# Patient Record
Sex: Male | Born: 1975 | Race: Black or African American | Hispanic: No | Marital: Single | Smoking: Never smoker
Health system: Southern US, Community
[De-identification: ages and names within clinical notes are randomized; demographics above are authoritative.]

## PROBLEM LIST (undated history)

## (undated) DIAGNOSIS — H209 Unspecified iridocyclitis: Secondary | ICD-10-CM

---

## 2010-02-22 ENCOUNTER — Emergency Department: Payer: Self-pay | Admitting: Unknown Physician Specialty

## 2011-09-15 ENCOUNTER — Emergency Department: Payer: Self-pay | Admitting: Emergency Medicine

## 2016-01-14 ENCOUNTER — Emergency Department
Admission: EM | Admit: 2016-01-14 | Discharge: 2016-01-14 | Disposition: A | Discharge status: Home / Self Care | Payer: Self-pay | Attending: Student | Admitting: Student

## 2016-01-14 DIAGNOSIS — H66004 Acute suppurative otitis media without spontaneous rupture of ear drum, recurrent, right ear: Secondary | ICD-10-CM | POA: Insufficient documentation

## 2016-01-14 MED ORDER — IBUPROFEN 600 MG PO TABS
600.0000 mg | ORAL_TABLET | Freq: Once | ORAL | Status: AC
  Administered 2016-01-14: 600 mg via ORAL
  Filled 2016-01-14: qty 1

## 2016-01-14 MED ORDER — NAPROXEN 500 MG PO TABS: 500.0000 mg | ORAL_TABLET | Freq: Two times a day (BID) | ORAL | Status: AC | PRN

## 2016-01-14 MED ORDER — AMOXICILLIN-POT CLAVULANATE 875-125 MG PO TABS: 1.0000 | ORAL_TABLET | Freq: Two times a day (BID) | ORAL | Status: AC

## 2016-01-14 NOTE — ED Notes (Addendum)
Pt states he finished a 10 day dose of amoxicillin for an ear infection on Monday 3/20 and felt symptoms were relieved until he started having symptoms of a head cold on Tuesday. States pain radiates from ear to jaw and rates pain as a 8.

## 2016-01-14 NOTE — ED Notes (Signed)
Pt alert and oriented X4, active, cooperative, pt in NAD. RR even and unlabored, color WNL.  Pt informed to return if any life threatening symptoms occur.   

## 2016-01-14 NOTE — ED Notes (Signed)
Pt in with co right earache x few weeks [facility] ago and treated with antibiotics for ear infection.  PT states symptoms not improved still co pain radiating to right jaw.

## 2016-01-14 NOTE — ED Provider Notes (Signed)
[facility] Emergency Department Provider Note  ____________________________________________  Time seen: Approximately 2:38 AM  I have reviewed the triage vital signs and the nursing notes.   HISTORY  Chief Complaint Otalgia    HPI Brian Francis is a 40 y.o. male with no chronic medical problems who presents for evaluation of 5 days atraumatic right ear pain, gradual onset, constant since onset, currently severe.  [facility] for right ear pain and diagnosed with acute otitis media as well as otitis externa and discharged with Corticosporin eardrops as well as oral penicillin. His symptoms had nearly resolved until 5 days ago when he developed nasal congestion, runny nose and has right ear pain recurred. No fevers or chills. No vomiting or diarrhea. No chest pain or difficulty breathing.   No past medical history on file.  There are no active problems to display for this patient.   No past surgical history on file.  Current Outpatient Rx  Name  Route  Sig  Dispense  Refill  . amoxicillin-clavulanate (AUGMENTIN) 875-125 MG tablet   Oral   Take 1 tablet by mouth 2 (two) times daily.   14 tablet   0   . naproxen (NAPROSYN) 500 MG tablet   Oral   Take 1 tablet (500 mg total) by mouth every 12 (twelve) hours as needed for moderate pain.   12 tablet   0     Allergies Review of patient's allergies indicates no known allergies.  No family history on file.  Social History Social History  Substance Use Topics  . Smoking status: Not on file  . Smokeless tobacco: Not on file  . Alcohol Use: Not on file    Review of Systems Constitutional: No fever/chills Eyes: No visual changes. ENT: No sore throat. Cardiovascular: Denies chest pain. Respiratory: Denies shortness of breath. Gastrointestinal: No abdominal pain.  No nausea, no vomiting.  No diarrhea.  No constipation. Genitourinary: Negative for dysuria. Musculoskeletal:  Negative for back pain. Skin: Negative for rash. Neurological: Negative for headaches, focal weakness or numbness.  10-point ROS otherwise negative.  ____________________________________________   PHYSICAL EXAM:  VITAL SIGNS: ED Triage Vitals  Enc Vitals Group     BP 01/14/16 0215 126/79 mmHg     Pulse Rate 01/14/16 0214 62     Resp 01/14/16 0214 18     Temp 01/14/16 0214 98.8 F (37.1 C)     Temp Source 01/14/16 0214 Oral     SpO2 01/14/16 0214 100 %     Weight 01/14/16 0214 240 lb (108.863 kg)     Height 01/14/16 0214 5\' 5"  (1.651 m)     Head Cir --      Peak Flow --      Pain Score 01/14/16 0214 9     Pain Loc --      Pain Edu? --      Excl. in GC? --     Constitutional: Alert and oriented. Well appearing and in no acute distress. Eyes: Conjunctivae are normal. PERRL. EOMI. Head: Atraumatic. Nose: + congestion/rhinnorhea. Ears: right TM is bulging with distorted landmarks. Normal left TM. Mouth/Throat: Mucous membranes are moist.  Oropharynx non-erythematous. Neck: No stridor.  Supple without meningismus. Cardiovascular: Normal rate, regular rhythm. Grossly normal heart sounds.  Good peripheral circulation. Respiratory: Normal respiratory effort.  No retractions. Lungs CTAB. Gastrointestinal: Soft and nontender. No distention.  No CVA tenderness. Genitourinary: deferred Musculoskeletal: No lower extremity tenderness nor edema.  No joint effusions.  Neurologic:  Normal speech and language. No gross focal neurologic deficits are appreciated. No gait instability. Skin:  Skin is warm, dry and intact. No rash noted. Psychiatric: Mood and affect are normal. Speech and behavior are normal.  ____________________________________________   LABS (all labs ordered are listed, but only abnormal results are displayed)  Labs Reviewed - No data to  display ____________________________________________  EKG  none ____________________________________________  RADIOLOGY  none ____________________________________________   PROCEDURES  Procedure(s) performed: None  Critical Care performed: No  ____________________________________________   INITIAL IMPRESSION / ASSESSMENT AND PLAN / ED COURSE  Pertinent labs & imaging results that were available during my care of the patient were reviewed by me and considered in my medical decision making (see chart for details).  Brian Francis is a 40 y.o. male with no chronic medical problems who presents for evaluation of 5 days atraumatic right ear pain. On exam, he is well-appearing and in no acute distress. Vital signs stable, he is afebrile. Exam is consistent with recurrent right acute otitis media. There is no evidence to support recurrent otitis externa. We'll discharge with Augmentin. We discussed return precautions, need for close PCP and ENT follow-up and he is comfortable with the discharge plan. DC home. ____________________________________________   FINAL CLINICAL IMPRESSION(S) / ED DIAGNOSES  Final diagnoses:  Recurrent acute suppurative otitis media of right ear without spontaneous rupture of tympanic membrane      Gayla Doss, MD 01/14/16 [phone]

## 2017-08-25 DIAGNOSIS — H5711 Ocular pain, right eye: Secondary | ICD-10-CM | POA: Insufficient documentation

## 2017-08-25 NOTE — ED Triage Notes (Signed)
Pt has a history of iritis - he states that he is having right eye pain and that it feels the same as when he has been dx with iritis - right eye is red and irritated

## 2017-08-26 ENCOUNTER — Emergency Department
Admission: EM | Admit: 2017-08-26 | Discharge: 2017-08-26 | Disposition: A | Discharge status: Home / Self Care | Payer: Self-pay | Attending: Emergency Medicine | Admitting: Emergency Medicine

## 2017-08-26 DIAGNOSIS — H5711 Ocular pain, right eye: Secondary | ICD-10-CM

## 2017-08-26 DIAGNOSIS — H5789 Other specified disorders of eye and adnexa: Secondary | ICD-10-CM

## 2017-08-26 HISTORY — DX: Unspecified iridocyclitis: H20.9

## 2017-08-26 MED ORDER — ERYTHROMYCIN 5 MG/GM OP OINT
TOPICAL_OINTMENT | Freq: Once | OPHTHALMIC | Status: DC
  Filled 2017-08-26: qty 1

## 2017-08-26 MED ORDER — TETRACAINE HCL 0.5 % OP SOLN
OPHTHALMIC | Status: AC
  Filled 2017-08-26: qty 4

## 2017-08-26 MED ORDER — ERYTHROMYCIN 5 MG/GM OP OINT
1.0000 | TOPICAL_OINTMENT | Freq: Three times a day (TID) | OPHTHALMIC | 0 refills | Status: AC
Start: 2017-08-26 — End: 2017-08-31

## 2017-08-26 MED ORDER — FLUORESCEIN SODIUM 1 MG OP STRP
ORAL_STRIP | OPHTHALMIC | Status: AC
  Filled 2017-08-26: qty 1

## 2017-08-26 NOTE — Discharge Instructions (Signed)
PLease follow up with opthalmology for further evaluation of your eye symptoms

## 2017-08-26 NOTE — ED Provider Notes (Signed)
[facility] Emergency Department Provider Note   ____________________________________________   First MD Initiated Contact with Patient 08/26/17 0127     (approximate)  I have reviewed the triage vital signs and the nursing notes.   HISTORY  Chief Complaint Eye Pain    HPI Brian Francis is a 41 y.o. male who comes into the hospital today with some right thigh pain. The patient states that he has been diagnosed with iritis in the past. He states that he woke up with some right sided eye redness and pain. He denies any discharge. It started about 4 days ago. The patient states that he's had iritis one year ago and he is unsure why he keeps getting it. He reports it is not so severe that the light bothers him and he reports he is able to open his eyes. The patient denies any injury. He states that the redness and pain is worse in the mornings and it usually gets better over the day. The patient has been using over-the-counter eyedrops and gel. He does not recall the name of the medication he has been taking. He has not followed up with his eye doctor. He says that he's had some mildly blurred vision and rates his pain as 3-4 out of 10 in intensity currently. The patient is here today for evaluation.   Past Medical History:  Diagnosis Date  . Iritis     There are no active problems to display for this patient.   History reviewed. No pertinent surgical history.  Prior to Admission medications   Medication Sig Start Date End Date Taking? Authorizing Provider  erythromycin [facility](ROMYCIN) ophthalmic ointment Place 1 application into the right eye 3 (three) times daily. 08/26/17 08/31/17  Rebecka ApleyWebster, Allison P, MD    Allergies Patient has no known allergies.  No family history on file.  Social History Social History   Tobacco Use  . Smoking status: Never Smoker  . Smokeless tobacco: Never Used  Substance Use Topics  . Alcohol use: Yes    Alcohol/week: 1.2 oz      Types: 2 Cans of beer per week    Comment: every other day  . Drug use: No    Review of Systems  Constitutional: No fever/chills Eyes: Right eye pain and redness with some mild blurred vision ENT: No sore throat. Cardiovascular: Denies chest pain. Respiratory: Denies shortness of breath. Gastrointestinal: No abdominal pain.  No nausea, no vomiting.  No diarrhea.  No constipation. Genitourinary: Negative for dysuria. Musculoskeletal: Negative for back pain. Skin: Negative for rash. Neurological: Negative for headaches, focal weakness or numbness.   ____________________________________________   PHYSICAL EXAM:  VITAL SIGNS: ED Triage Vitals  Enc Vitals Group     BP 08/25/17 2153 (!) 145/90     Pulse Rate 08/25/17 2153 73     Resp 08/25/17 2153 16     Temp 08/25/17 2153 98.6 F (37 C)     Temp Source 08/25/17 2153 Oral     SpO2 08/25/17 2153 100 %     Weight 08/25/17 2153 240 lb (108.9 kg)     Height 08/25/17 2153 5\' 4"  (1.626 m)     Head Circumference --      Peak Flow --      Pain Score 08/25/17 2152 3     Pain Loc --      Pain Edu? --      Excl. in GC? --     Constitutional: Alert and oriented. Well  appearing and in mild distress. Eyes: Conjunctivae are normal. PERRL. EOMI. no uptake on fluorescein staining visual acuity, mild erythema to sclerae are 20/15 bilaterally Head: Atraumatic. Nose: No congestion/rhinnorhea. Mouth/Throat: Mucous membranes are moist.  Oropharynx non-erythematous. Cardiovascular: Normal rate, regular rhythm. Grossly normal heart sounds.  Good peripheral circulation. Respiratory: Normal respiratory effort.  No retractions. Lungs CTAB. Gastrointestinal: Soft and nontender. No distention.  Musculoskeletal: No lower extremity tenderness nor edema.   Neurologic:  Normal speech and language.  gait instability. Skin:  Skin is warm, dry and intact.  Psychiatric: Mood and affect are normal.   ____________________________________________    LABS (all labs ordered are listed, but only abnormal results are displayed)  Labs Reviewed - No data to display ____________________________________________  EKG  none ____________________________________________  RADIOLOGY  No results found.  ____________________________________________   PROCEDURES  Procedure(s) performed: None  Procedures  Critical Care performed: No  ____________________________________________   INITIAL IMPRESSION / ASSESSMENT AND PLAN / ED COURSE  As part of my medical decision making, I reviewed the following data within the electronic MEDICAL RECORD NUMBER Notes from prior ED visits and [place] Controlled Substance Database  This is a 41 year old male who comes into the hospital today with some right-sided eye pain. He's had symptoms for the past 3 days and reports that it is not going away. He has had iritis in the past and is concerned about symptoms. On exam the patient has no uptake and he has minimal erythema to the sclera. The patient also has no conjunctival injection or drainage. I will give the patient some erythromycin eye ointment and have him follow-up with ophthalmology. The patient has no photophobia and no pain with accommodation and no other concerns. He will be discharged home.      ____________________________________________   FINAL CLINICAL IMPRESSION(S) / ED DIAGNOSES  Final diagnoses:  Pain of right eye  Eye irritation      NEW MEDICATIONS STARTED DURING THIS VISIT:  This SmartLink is deprecated. Use AVSMEDLIST instead to display the medication list for a patient.   Note:  This document was prepared using Dragon voice recognition software and may include unintentional dictation errors.    Rebecka Apley, MD 08/26/17 [phone]

## 2017-08-26 NOTE — ED Notes (Signed)
Patient discharged on paper charting. Erythromycin ointment administered prior to discharge - see paper charting.  Patient's last stated pain score 3 out of 10, right eye.

## 2018-06-09 ENCOUNTER — Other Ambulatory Visit: Payer: Self-pay

## 2018-06-09 ENCOUNTER — Emergency Department: Payer: BLUE CROSS/BLUE SHIELD

## 2018-06-09 ENCOUNTER — Emergency Department
Admission: EM | Admit: 2018-06-09 | Discharge: 2018-06-09 | Disposition: A | Discharge status: Home / Self Care | Payer: BLUE CROSS/BLUE SHIELD | Attending: Emergency Medicine | Admitting: Emergency Medicine

## 2018-06-09 DIAGNOSIS — R319 Hematuria, unspecified: Secondary | ICD-10-CM | POA: Diagnosis present

## 2018-06-09 DIAGNOSIS — N5089 Other specified disorders of the male genital organs: Secondary | ICD-10-CM

## 2018-06-09 LAB — BASIC METABOLIC PANEL
ANION GAP: 7 (ref 5–15)
BUN: 10 mg/dL (ref 6–20)
CHLORIDE: 104 mmol/L (ref 98–111)
CO2: 25 mmol/L (ref 22–32)
Calcium: 8.8 mg/dL — ABNORMAL LOW (ref 8.9–10.3)
Creatinine, Ser: 1.1 mg/dL (ref 0.61–1.24)
GFR calc non Af Amer: >60 mL/min (ref >60)
Glucose, Bld: 94 mg/dL (ref 70–99)
POTASSIUM: 3.8 mmol/L (ref 3.5–5.1)
Sodium: 136 mmol/L (ref 135–145)

## 2018-06-09 LAB — CBC
HEMATOCRIT: 40.4 % (ref 40.0–52.0)
HEMOGLOBIN: 13.3 g/dL (ref 13.0–18.0)
MCH: 27.2 pg (ref 26.0–34.0)
MCHC: 32.9 g/dL (ref 32.0–36.0)
MCV: 82.7 fL (ref 80.0–100.0)
Platelets: 431 10*3/uL (ref 150–440)
RBC: 4.89 MIL/uL (ref 4.40–5.90)
RDW: 17 % — ABNORMAL HIGH (ref 11.5–14.5)
WBC: 9 10*3/uL (ref 3.8–10.6)

## 2018-06-09 LAB — URINALYSIS, COMPLETE (UACMP) WITH MICROSCOPIC
RBC / HPF: >50 RBC/hpf — ABNORMAL HIGH (ref 0–5)
SPECIFIC GRAVITY, URINE: 1.021 (ref 1.005–1.030)
Squamous Epithelial / LPF: NONE SEEN (ref 0–5)

## 2018-06-09 MED ORDER — CIPROFLOXACIN HCL 500 MG PO TABS: 500.0000 mg | ORAL_TABLET | Freq: Two times a day (BID) | ORAL | 0 refills | Status: AC

## 2018-06-09 NOTE — Discharge Instructions (Addendum)
You have blood and white blood cells in the urine.  This may be a sign of a UTI.  I will give you some Cipro 1 twice a day for 10 days.  Please return here or [facility] or the urologist if the blood in the urine does not clear up or if it clears up and returns again.  I [facility] or what, sometimes the urine can look clear but still have microscopic amounts of red cells in it.

## 2018-06-09 NOTE — ED Provider Notes (Signed)
[facility] Emergency Department Provider Note   ____________________________________________   First MD Initiated Contact with Patient 06/09/18 1835     (approximate)  I have reviewed the triage vital signs and the nursing notes.   HISTORY  Chief Complaint Hematuria  HPI Brian Francis is a 42 y.o. male who reports his urine has been dark for a couple days.  He is unsure if there is blood in his urine.  He has no dysuria.  He has no flank pain.  He has no abdominal pain nausea vomiting fever chills or any other complaints.  He just has dark urine.  He has not been working out in the hot sun.  He does not feel dehydrated.   Past Medical History:  Diagnosis Date  . Iritis     There are no active problems to display for this patient.   History reviewed. No pertinent surgical history.  Prior to Admission medications   Medication Sig Start Date End Date Taking? Authorizing Provider  ciprofloxacin (CIPRO) 500 MG tablet Take 1 tablet (500 mg total) by mouth 2 (two) times daily for 10 days. 06/09/18 06/19/18  Arnaldo NatalMalinda, Paul F, MD    Allergies Patient has no known allergies.  History reviewed. No pertinent family history.  Social History Social History   Tobacco Use  . Smoking status: Never Smoker  . Smokeless tobacco: Never Used  Substance Use Topics  . Alcohol use: Yes    Alcohol/week: 2.0 standard drinks    Types: 2 Cans of beer per week    Comment: every other day  . Drug use: No    Review of Systems  Constitutional: No fever/chills Eyes: No visual changes. ENT: No sore throat. Cardiovascular: Denies chest pain. Respiratory: Denies shortness of breath. Gastrointestinal: No abdominal pain.  No nausea, no vomiting.  No diarrhea.  No constipation. Genitourinary: Negative for dysuria. Musculoskeletal: Negative for back pain. Skin: Negative for rash. Neurological: Negative for headaches, focal weakness    ____________________________________________   PHYSICAL EXAM:  VITAL SIGNS: ED Triage Vitals [06/09/18 1634]  Enc Vitals Group     BP 123/89     Pulse Rate 90     Resp 18     Temp 99 F (37.2 C)     Temp Source Oral     SpO2 96 %     Weight 240 lb (108.9 kg)     Height 5\' 5"  (1.651 m)     Head Circumference      Peak Flow      Pain Score 0     Pain Loc      Pain Edu?      Excl. in GC?     Constitutional: Alert and oriented. Well appearing and in no acute distress. Eyes: Conjunctivae are normal.. Head: Atraumatic. Nose: No congestion/rhinnorhea. Mouth/Throat: Mucous membranes are moist.  Oropharynx non-erythematous. Neck: No stridor.   Cardiovascular: Normal rate, regular rhythm. Grossly normal heart sounds.  Good peripheral circulation. Respiratory: Normal respiratory effort.  No retractions. Lungs CTAB. Gastrointestinal: Soft and nontender. No distention. No abdominal bruits. No CVA tenderness. Genitourinary: Male circumcised.  One testicle seems to have a small mass there.  It is unchanged from previously.  Patient however says it was supposed to have gone away and it has not.  He has for an ultrasound I will do this. Musculoskeletal: No lower extremity tenderness nor edema. Neurologic:  Normal speech and language. No gross focal neurologic deficits are appreciated. No gait instability. Skin:  Skin is warm, dry and intact. No rash noted. Psychiatric: Mood and affect are normal. Speech and behavior are normal.  ____________________________________________   LABS (all labs ordered are listed, but only abnormal results are displayed)  Labs Reviewed  URINALYSIS, COMPLETE (UACMP) WITH MICROSCOPIC - Abnormal; Notable for the following components:      Result Value   Color, Urine RED (*)    APPearance CLOUDY (*)    Glucose, UA   (*)    Value: TEST NOT REPORTED DUE TO COLOR INTERFERENCE OF URINE PIGMENT   Hgb urine dipstick   (*)    Value: TEST NOT REPORTED DUE TO  COLOR INTERFERENCE OF URINE PIGMENT   Bilirubin Urine   (*)    Value: TEST NOT REPORTED DUE TO COLOR INTERFERENCE OF URINE PIGMENT   Ketones, ur   (*)    Value: TEST NOT REPORTED DUE TO COLOR INTERFERENCE OF URINE PIGMENT   Protein, ur   (*)    Value: TEST NOT REPORTED DUE TO COLOR INTERFERENCE OF URINE PIGMENT   Nitrite   (*)    Value: TEST NOT REPORTED DUE TO COLOR INTERFERENCE OF URINE PIGMENT   Leukocytes, UA   (*)    Value: TEST NOT REPORTED DUE TO COLOR INTERFERENCE OF URINE PIGMENT   RBC / HPF >50 (*)    Bacteria, UA RARE (*)    All other components within normal limits  BASIC METABOLIC PANEL - Abnormal; Notable for the following components:   Calcium 8.8 (*)    All other components within normal limits  CBC - Abnormal; Notable for the following components:   RDW 17.0 (*)    All other components within normal limits  URINE CULTURE   ____________________________________________  EKG   ____________________________________________  RADIOLOGY  ED MD interpretation: Ultrasound shows a cyst which is unchanged.  Official radiology report(s): [place] Scrotum  Result Date: 06/09/2018 CLINICAL DATA:  Palpable mass on the right EXAM: SCROTAL ULTRASOUND DOPPLER ULTRASOUND OF THE TESTICLES TECHNIQUE: Complete ultrasound examination of the testicles, epididymis, and other scrotal structures was performed. Color and spectral Doppler ultrasound were also utilized to evaluate blood flow to the testicles. COMPARISON:  Report of [facility], [facility] FINDINGS: Right testicle Measurements: 4.4 x 2.5 x 3.2 cm. No mass or microlithiasis visualized. Left testicle Measurements: 4.2 x 2.1 x 2.8 cm. No mass or microlithiasis visualized. There is a benign coarse calcification in the mid left testis. Right epididymis: There is a cystic structure containing slight debris in the epididymis at the junction of the head and body on the right measuring 2.1 x 1.4 x 1.5 cm.  No other mass evident. No inflammatory focus. Left epididymis: Normal in size and appearance. No inflammatory focus. Hydrocele: There is a small hydrocele on the right. There is a minimal hydrocele on the left. Varicocele:  None visualized. Pulsed Doppler interrogation of both testes demonstrates normal low resistance arterial and venous waveforms bilaterally. There are benign lymph nodes noted in each inguinal canal region, not enlarged. IMPRESSION: 1. 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 There is slight debris within this apparent spermatocele. No other extratesticular mass seen. No evidence of epididymitis on either side. 2. No intratesticular mass or orchitis on either side. Focus of benign-appearing calcification in mid left testis. No evidence of testicular torsion on either  side. 3.  Small right hydrocele.  Minimal left hydrocele. 4.  Benign-appearing lymph nodes in the each inguinal canal region. Electronically Signed   By: Bretta Bang III M.D.   On: 06/09/2018 20:27   US Scrotum Doppler  Result Date: 06/09/2018 CLINICAL DATA:  Palpable mass on the right EXAM: SCROTAL ULTRASOUND DOPPLER ULTRASOUND OF THE TESTICLES TECHNIQUE: Complete ultrasound examination of the testicles, epididymis, and other scrotal structures was performed. Color and spectral Doppler ultrasound were also utilized to evaluate blood flow to the testicles. COMPARISON:  Report of prior scrotal ultrasound from [facility] May 28, 2016 FINDINGS: Right testicle Measurements: 4.4 x 2.5 x 3.2 cm. No mass or microlithiasis visualized. Left testicle Measurements: 4.2 x 2.1 x 2.8 cm. No mass or microlithiasis visualized. There is a benign coarse calcification in the mid left testis. Right epididymis: There is a cystic structure containing slight debris in the epididymis  at the junction of the head and body on the right measuring 2.1 x 1.4 x 1.5 cm. No other mass evident. No inflammatory focus. Left epididymis: Normal in size and appearance. No inflammatory focus. Hydrocele: There is a small hydrocele on the right. There is a minimal hydrocele on the left. Varicocele:  None visualized. Pulsed Doppler interrogation of both testes demonstrates normal low resistance arterial and venous waveforms bilaterally. There are benign lymph nodes noted in each inguinal canal region, not enlarged. IMPRESSION: 1. 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 There is slight debris within this apparent spermatocele. No other extratesticular mass seen. No evidence of epididymitis on either side. 2. No intratesticular mass or orchitis on either side. Focus of benign-appearing calcification in mid left testis. No evidence of testicular torsion on either side. 3.  Small right hydrocele.  Minimal left hydrocele. 4.  Benign-appearing lymph nodes in the each inguinal canal region. Electronically Signed   By: Bretta Bang III M.D.   On: 06/09/2018 20:27    ____________________________________________   PROCEDURES  Procedure(s) performed:  Procedures  Critical Care performed:   ____________________________________________   INITIAL IMPRESSION / ASSESSMENT AND PLAN / ED COURSE  Patient with hematuria as well as white blood cells.  I will treat him for UTI have him follow-up.  He will return also if worse.      ____________________________________________   FINAL CLINICAL IMPRESSION(S) / ED DIAGNOSES  Final diagnoses:  Hematuria, unspecified type     ED Discharge Orders         Ordered    ciprofloxacin (CIPRO) 500 MG tablet  2 times daily     06/09/18 2043           Note:  This document was prepared using  Dragon voice recognition software and may include unintentional dictation errors.    Arnaldo Natal, MD 06/09/18 2046

## 2018-06-09 NOTE — ED Notes (Signed)
Pt states that he has had blood in his urine for the past three days. Pt alert and oriented x 4. Pt also reports having some pain on his left side. Family at the bedside.

## 2018-06-09 NOTE — ED Triage Notes (Signed)
Pt states urine is different color x few days. Unsure if blood in urine. Alert, oriented. Ambulatory. Denies dysuria and frequency. Denies pain.

## 2018-06-11 LAB — URINE CULTURE: CULTURE: NO GROWTH

## 2018-10-05 ENCOUNTER — Other Ambulatory Visit: Payer: Self-pay

## 2018-10-05 ENCOUNTER — Ambulatory Visit
Admission: EM | Admit: 2018-10-05 | Discharge: 2018-10-05 | Disposition: A | Discharge status: Home / Self Care | Payer: BLUE CROSS/BLUE SHIELD

## 2018-10-05 ENCOUNTER — Encounter: Payer: Self-pay | Admitting: Gynecology

## 2018-10-05 DIAGNOSIS — M26621 Arthralgia of right temporomandibular joint: Secondary | ICD-10-CM | POA: Insufficient documentation

## 2018-10-05 MED ORDER — NAPROXEN 500 MG PO TABS: 500.0000 mg | ORAL_TABLET | Freq: Two times a day (BID) | ORAL | 0 refills | Status: AC

## 2018-10-05 NOTE — ED Triage Notes (Signed)
Per patient with dental pain at right lower jaw x 4 days. Per patient tooth pain affecting his right ear/jaw.

## 2018-10-05 NOTE — Discharge Instructions (Addendum)
Soft food for several weeks.  Consider using a football mouthguard at bedtime during sleep  in case you are grinding your teeth at night.  Follow-up with a oral surgeon if not improving

## 2018-10-05 NOTE — ED Provider Notes (Signed)
[facility]    CSN: [id number] Arrival date & time: 10/05/18  4098     History   Chief Complaint Chief Complaint  Patient presents with  . Dental Pain    HPI Brian Francis is a 42 y.o. male.   HPI  42 year old male presents with, jaw and right ear pain that is had for 4 days.  He thought at first that it may be his tooth.  He states it does hurt to chew.  He notices that it seems worse in the morning when he first awakens.  Has been taking Advil cold and flu medication which makes it less bothersome.  He denies any fever or chills.         Past Medical History:  Diagnosis Date  . Iritis     There are no active problems to display for this patient.   History reviewed. No pertinent surgical history.     Home Medications    Prior to Admission medications   Medication Sig Start Date End Date Taking? Authorizing Provider  ketoconazole (NIZORAL) 2 % cream ketoconazole 2 % topical cream    [provider]  naproxen (NAPROSYN) 500 MG tablet Take 1 tablet (500 mg total) by mouth 2 (two) times daily with a meal. 10/05/18   Lutricia Feil, PA-C    Family History Family History  Problem Relation Age of Onset  . Diabetes Mother     Social History Social History   Tobacco Use  . Smoking status: Never Smoker  . Smokeless tobacco: Never Used  Substance Use Topics  . Alcohol use: Yes    Alcohol/week: 2.0 standard drinks    Types: 2 Cans of beer per week    Comment: every other day  . Drug use: No     Allergies   Other   Review of Systems Review of Systems  Constitutional: Negative for activity change, appetite change, chills, fatigue and fever.  HENT: Positive for dental problem.   All other systems reviewed and are negative.    Physical Exam Triage Vital Signs ED Triage Vitals  Enc Vitals Group     BP 10/05/18 0833 126/86     Pulse Rate 10/05/18 0833 80     Resp 10/05/18 0833 18     Temp 10/05/18 0833 97.8 F (36.6 C)      Temp Source 10/05/18 0833 Oral     SpO2 10/05/18 0833 100 %     Weight 10/05/18 0834 240 lb (108.9 kg)     Height 10/05/18 0834 5\' 4"  (1.626 m)     Head Circumference --      Peak Flow --      Pain Score 10/05/18 0834 5     Pain Loc --      Pain Edu? --      Excl. in GC? --    No data found.  Updated Vital Signs BP 126/86 (BP Location: Left Arm)   Pulse 80   Temp 97.8 F (36.6 C) (Oral)   Resp 18   Ht 5\' 4"  (1.626 m)   Wt 240 lb (108.9 kg)   SpO2 100%   BMI 41.20 kg/m   Visual Acuity Right Eye Distance:   Left Eye Distance:   Bilateral Distance:    Right Eye Near:   Left Eye Near:    Bilateral Near:     Physical Exam Vitals signs and nursing note reviewed.  Constitutional:      General: He is not in  acute distress.    Appearance: Normal appearance. He is not ill-appearing, toxic-appearing or diaphoretic.  HENT:     Head: Normocephalic.     Comments: Exam of the head and neck shows a tenderness over the right TMJ that is not present on the left.  Being and closing the mouth exacerbates the pain.  The patient has a tendency to not open fully but with full opening of his mouth the pain is much worse.  Examination of the teeth show them to be in good repair.  There is no evidence of inflammation or abscess of the gums either on the buccal or the lingual surface.  There is no facial swelling.  There is no erythema present.    Right Ear: Tympanic membrane and ear canal normal.     Left Ear: Tympanic membrane and ear canal normal.     Nose: Nose normal.     Mouth/Throat:     Mouth: Mucous membranes are moist.     Pharynx: Oropharynx is clear. No oropharyngeal exudate or posterior oropharyngeal erythema.  Eyes:     Extraocular Movements: Extraocular movements intact.     Pupils: Pupils are equal, round, and reactive to light.  Neck:     Musculoskeletal: Normal range of motion and neck supple. No neck rigidity or muscular tenderness.  Musculoskeletal: Normal range of  motion.  Lymphadenopathy:     Cervical: No cervical adenopathy.  Skin:    General: Skin is warm and dry.  Neurological:     General: No focal deficit present.     Mental Status: He is alert and oriented to person, place, and time.  Psychiatric:        Mood and Affect: Mood normal.        Behavior: Behavior normal.        Thought Content: Thought content normal.        Judgment: Judgment normal.      UC Treatments / Results  Labs (all labs ordered are listed, but only abnormal results are displayed) Labs Reviewed - No data to display  EKG None  Radiology No results found.  Procedures Procedures (including critical care time)  Medications Ordered in UC Medications - No data to display  Initial Impression / Assessment and Plan / UC Course  I have reviewed the triage vital signs and the nursing notes.  Pertinent labs & imaging results that were available during my care of the patient were reviewed by me and considered in my medical decision making (see chart for details).   I explained to the patient that he has a TMJ tenderness  on the right.  We will treat him with  Naprosyn 500 mg twice daily with food.  He should avoid chewy foods and try to eat  softer foods.  Also with the possibility of bruxism, I have recommended using a quarterback football mouthguard that he may use at nighttime to cushion grinding since his symptoms seem to be worse in the morning.  If he is not improving he should follow-up with an oral surgeon.   Final Clinical Impressions(s) / UC Diagnoses   Final diagnoses:  TMJ tenderness, right     Discharge Instructions     Soft food for several weeks.  Consider using a football mouthguard at bedtime during sleep  in case you are grinding your teeth at night.  Follow-up with a oral surgeon if not improving   ED Prescriptions    Medication Sig Dispense Auth. Provider   naproxen (  NAPROSYN) 500 MG tablet Take 1 tablet (500 mg total) by mouth 2 (two)  times daily with a meal. 60 tablet Lutricia Feiloemer, Nan Maya P, PA-C     Controlled Substance Prescriptions [place] Controlled Substance Registry consulted? Not Applicable   Lutricia FeilRoemer, Teddrick Mallari P, PA-C 10/05/18 [id number]

## 2019-06-10 ENCOUNTER — Ambulatory Visit
Admission: RE | Admit: 2019-06-10 | Discharge: 2019-06-10 | Disposition: A | Discharge status: Home / Self Care | Payer: BLUE CROSS/BLUE SHIELD | Source: Ambulatory Visit | Attending: Ophthalmology | Admitting: Ophthalmology

## 2019-06-10 ENCOUNTER — Other Ambulatory Visit: Payer: Self-pay | Admitting: Ophthalmology

## 2019-06-10 ENCOUNTER — Other Ambulatory Visit
Admission: RE | Admit: 2019-06-10 | Discharge: 2019-06-10 | Disposition: A | Discharge status: Home / Self Care | Payer: BLUE CROSS/BLUE SHIELD | Source: Ambulatory Visit | Attending: Ophthalmology | Admitting: Ophthalmology

## 2019-06-10 DIAGNOSIS — R0602 Shortness of breath: Secondary | ICD-10-CM | POA: Insufficient documentation

## 2019-06-10 LAB — CBC
HCT: 40.6 % (ref 39.0–52.0)
Hemoglobin: 13.2 g/dL (ref 13.0–17.0)
MCH: 27 pg (ref 26.0–34.0)
MCHC: 32.5 g/dL (ref 30.0–36.0)
MCV: 83.2 fL (ref 80.0–100.0)
Platelets: 410 10*3/uL — ABNORMAL HIGH (ref 150–400)
RBC: 4.88 MIL/uL (ref 4.22–5.81)
RDW: 15.9 % — ABNORMAL HIGH (ref 11.5–15.5)
WBC: 9.1 10*3/uL (ref 4.0–10.5)
nRBC: 0 % (ref 0.0–0.2)

## 2019-06-10 LAB — SEDIMENTATION RATE: Sed Rate: 21 mm/hr — ABNORMAL HIGH (ref 0–15)

## 2019-06-11 LAB — ANGIOTENSIN CONVERTING ENZYME: Angiotensin-Converting Enzyme: 18 U/L (ref 14–82)

## 2019-06-11 LAB — ANCA TITERS
Atypical P-ANCA titer: <1:20 {titer}
C-ANCA: <1:20 {titer}
P-ANCA: <1:20 {titer}

## 2019-06-12 LAB — RPR, QUANT+TP ABS (REFLEX)
Rapid Plasma Reagin, Quant: 1:1 {titer} — ABNORMAL HIGH
T Pallidum Abs: NONREACTIVE

## 2019-06-12 LAB — RPR: RPR Ser Ql: REACTIVE — AB

## 2019-06-13 LAB — QUANTIFERON-TB GOLD PLUS (RQFGPL)
QuantiFERON Mitogen Value: >10 IU/mL
QuantiFERON Nil Value: 0.01 IU/mL
QuantiFERON TB1 Ag Value: 0.03 IU/mL
QuantiFERON TB2 Ag Value: 0.02 IU/mL

## 2019-06-13 LAB — QUANTIFERON-TB GOLD PLUS: QuantiFERON-TB Gold Plus: NEGATIVE

## 2019-08-30 IMAGING — US US SCROTUM W/ DOPPLER COMPLETE
1 series · 13 of 25 positions shown · non-contrast
Comparison: Report of prior scrotal ultrasound from [HOSPITAL] [facility]
May 28, 2016

CLINICAL DATA: Palpable mass on the right

EXAM:
SCROTAL ULTRASOUND
DOPPLER ULTRASOUND OF THE TESTICLES
TECHNIQUE: Complete ultrasound examination of the testicles, epididymis, and
other scrotal structures was performed. Color and spectral Doppler
ultrasound were also utilized to evaluate blood flow to the
testicles.

[Series 1: us scrotum w/ doppler complete · 13 of 102 slices shown]
[im 1/102]
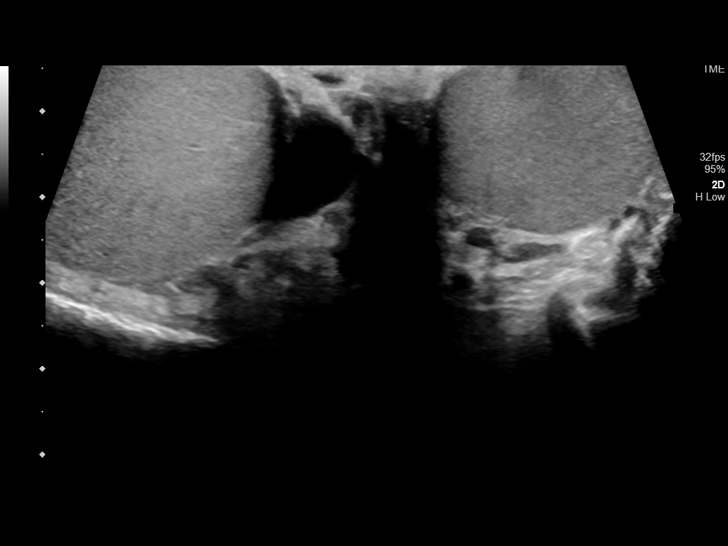
[im 9/102]
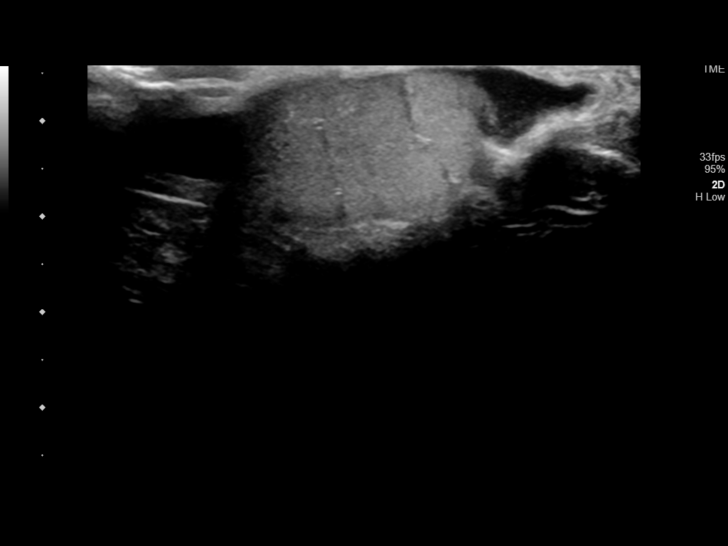
[im 17/102]
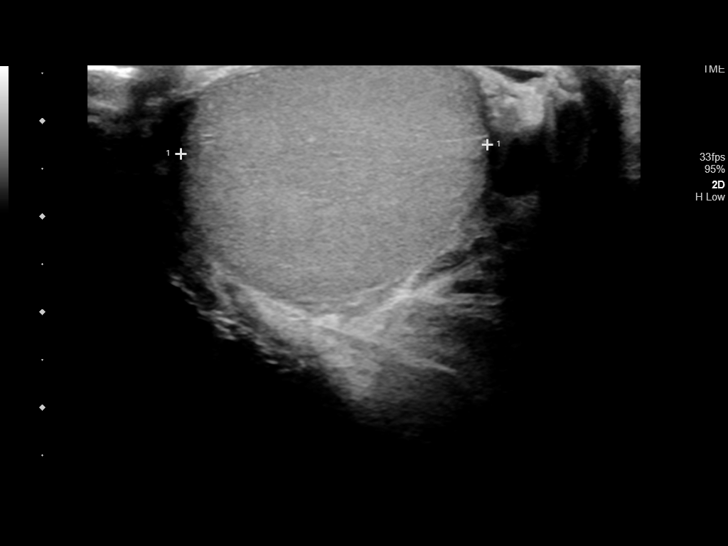
[im 26/102]
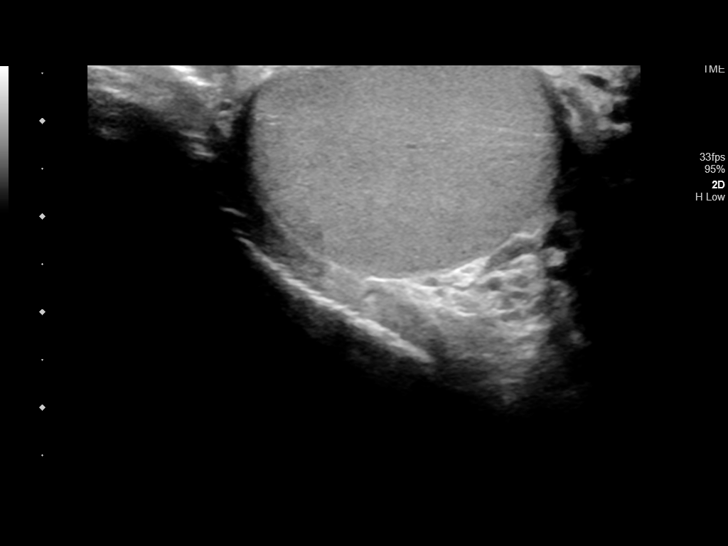
[im 34/102]
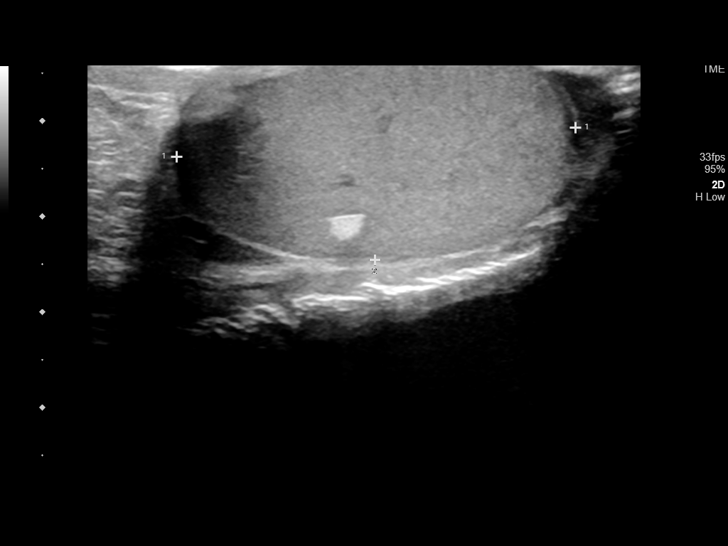
[im 43/102]
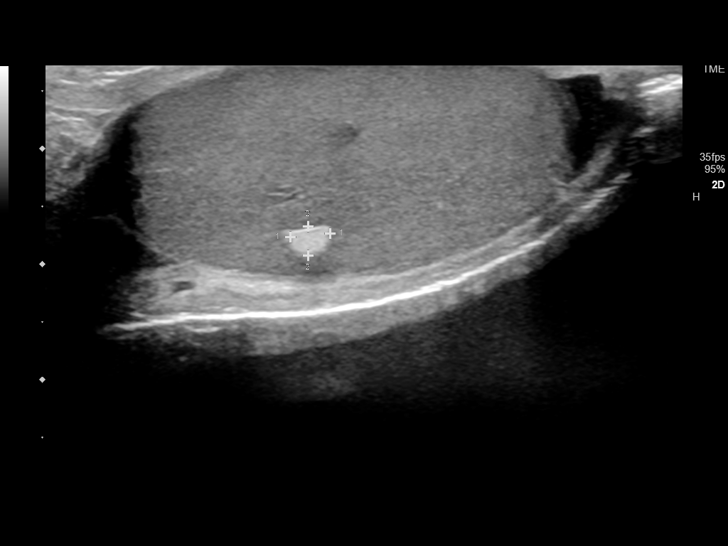
[im 51/102]
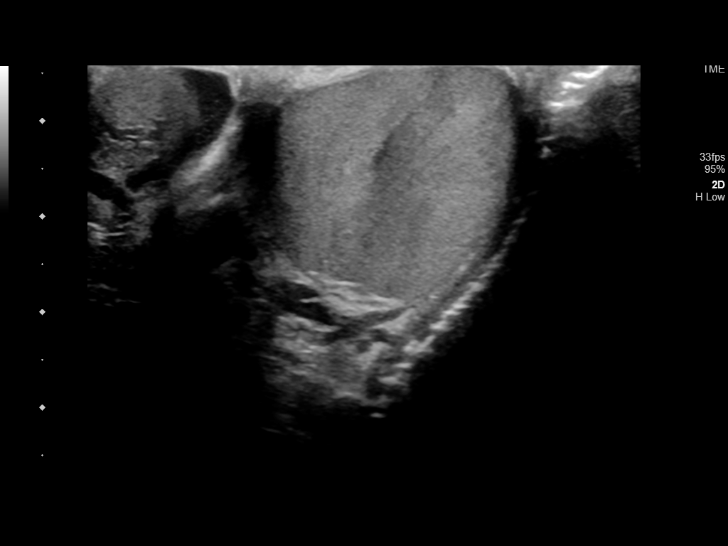
[im 59/102]
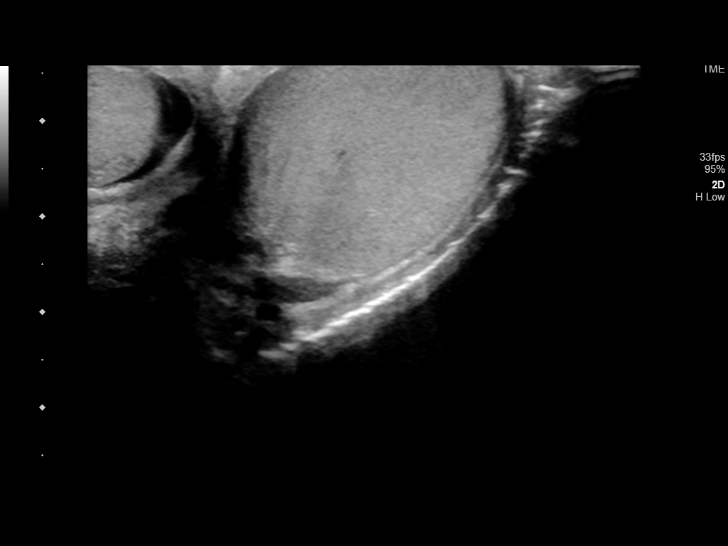
[im 68/102]
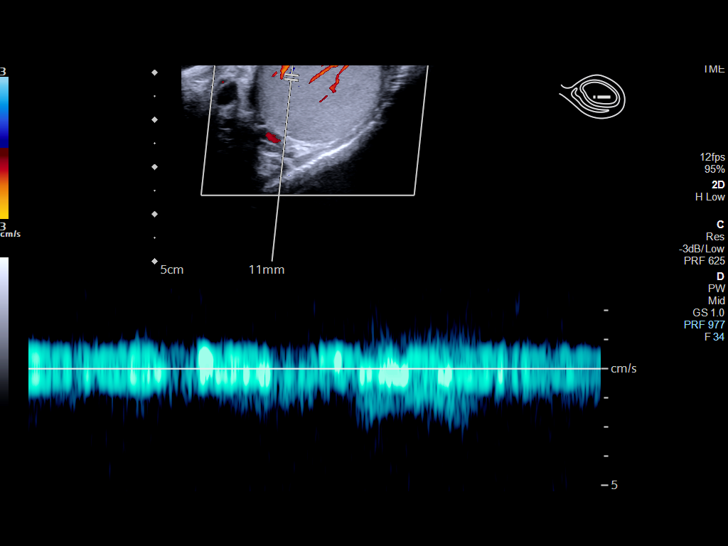
[im 76/102]
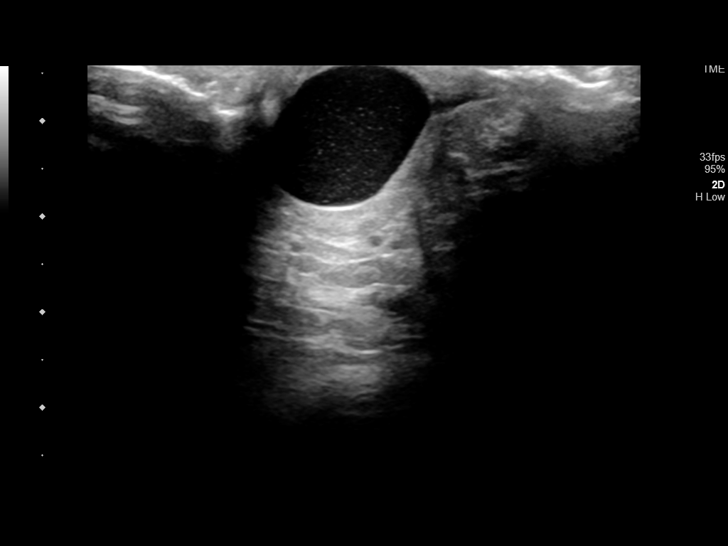
[im 85/102]
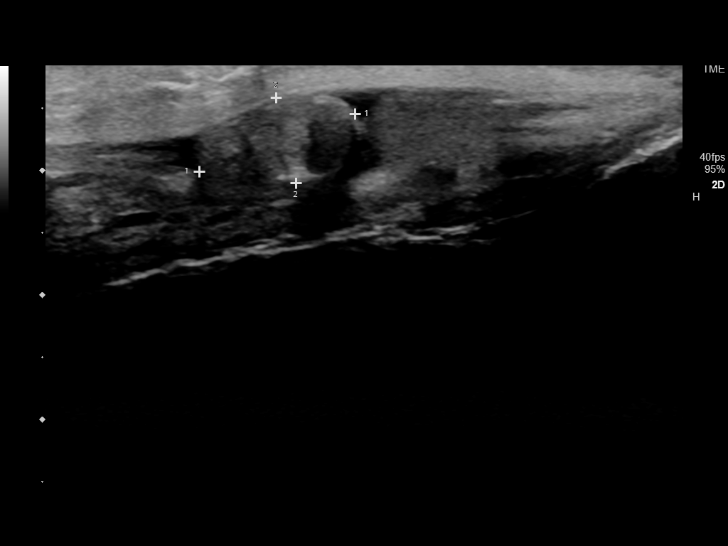
[im 93/102]
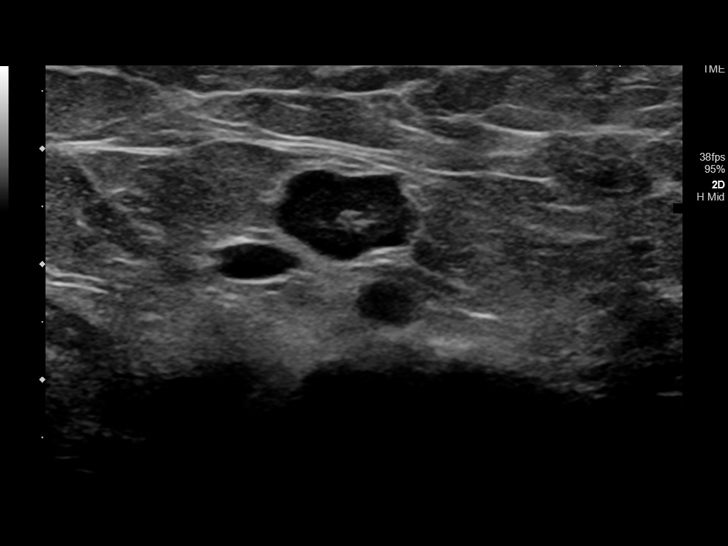
[im 102/102]
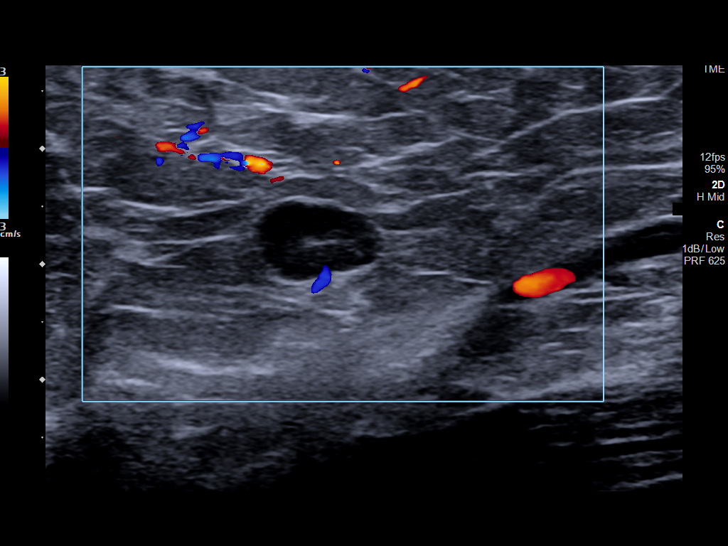

[13 of 25 positions shown; findings below may reference images not displayed]

FINDINGS: Right testicle

Measurements: 4.4 x 2.5 x 3.2 cm. No mass or microlithiasis
visualized.

Left testicle

Measurements: 4.2 x 2.1 x 2.8 cm. No mass or microlithiasis
visualized. There is a benign coarse calcification in the mid left
testis.

Right epididymis: There is a cystic structure containing slight
debris in the epididymis at the junction of the head and body on the
right measuring 2.1 x 1.4 x 1.5 cm. No other mass evident. No
inflammatory focus.

Left epididymis: Normal in size and appearance. No inflammatory
focus.

Hydrocele: There is a small hydrocele on the right. There is a
minimal hydrocele on the left.

Varicocele:  None visualized.

Pulsed Doppler interrogation of both testes demonstrates normal low
resistance arterial and venous waveforms bilaterally.

There are benign lymph nodes noted in each inguinal canal region,
not enlarged.
IMPRESSION: 1. Apparent spermatocele at the junction of the head and body of the
right epididymis, similar in size to previous similar appearing
lesion based on report from prior outside ultrasound from May 2016. There is slight debris within this apparent spermatocele. No
other extratesticular mass seen. No evidence of epididymitis on
either side.

2. No intratesticular mass or orchitis on either side. Focus of
benign-appearing calcification in mid left testis. No evidence of
testicular torsion on either side.

3.  Small right hydrocele.  Minimal left hydrocele.

4.  Benign-appearing lymph nodes in the each inguinal canal region.

## 2023-11-22 ENCOUNTER — Emergency Department: Payer: BC Managed Care – PPO

## 2023-11-22 ENCOUNTER — Emergency Department
Admission: EM | Admit: 2023-11-22 | Discharge: 2023-11-22 | Disposition: A | Discharge status: Home / Self Care | Payer: BC Managed Care – PPO | Attending: Emergency Medicine | Admitting: Emergency Medicine

## 2023-11-22 DIAGNOSIS — S2231XA Fracture of one rib, right side, initial encounter for closed fracture: Secondary | ICD-10-CM | POA: Diagnosis not present

## 2023-11-22 DIAGNOSIS — Y92410 Unspecified street and highway as the place of occurrence of the external cause: Secondary | ICD-10-CM | POA: Insufficient documentation

## 2023-11-22 DIAGNOSIS — R0781 Pleurodynia: Secondary | ICD-10-CM | POA: Diagnosis present

## 2023-11-22 MED ORDER — TRAMADOL HCL 50 MG PO TABS: 50.0000 mg | ORAL_TABLET | Freq: Four times a day (QID) | ORAL | 0 refills | Status: AC | PRN

## 2023-11-22 MED ORDER — METHOCARBAMOL 500 MG PO TABS: 500.0000 mg | ORAL_TABLET | Freq: Four times a day (QID) | ORAL | 0 refills | Status: AC
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
## 2023-11-22 NOTE — Discharge Instructions (Signed)
Follow-up with primary care if not improving over the week.  Ice pack over sore areas may help.  Prescription for muscle relaxer and pain medication submitted to the pharmacy.  Be aware that both of these medications may cause dizziness or drowsiness and you should not take them while driving or working.  Return to the emergency department for symptoms that change or worsen if you are unable to see primary care.
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
## 2023-12-01 ENCOUNTER — Ambulatory Visit
Admission: EM | Admit: 2023-12-01 | Discharge: 2023-12-01 | Disposition: A | Discharge status: Home / Self Care | Payer: BC Managed Care – PPO

## 2023-12-01 DIAGNOSIS — M25561 Pain in right knee: Secondary | ICD-10-CM | POA: Diagnosis not present

## 2023-12-01 DIAGNOSIS — M545 Low back pain, unspecified: Secondary | ICD-10-CM

## 2023-12-01 NOTE — Discharge Instructions (Addendum)
 Take Tylenol or ibuprofen  as needed for discomfort.  Follow-up with your primary care provider or an orthopedist such as the one listed below.
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
